# Patient Record
Sex: Male | Born: 1935 | Race: White | Hispanic: No | Marital: Married | Smoking: Former smoker
Health system: Southern US, Community
[De-identification: ages and names within clinical notes are randomized; demographics above are authoritative.]

## PROBLEM LIST (undated history)

## (undated) DIAGNOSIS — I10 Essential (primary) hypertension: Secondary | ICD-10-CM

## (undated) HISTORY — PX: CORONARY ARTERY BYPASS GRAFT: SHX141

## (undated) HISTORY — PX: OTHER SURGICAL HISTORY: SHX169

## (undated) HISTORY — PX: INNER EAR SURGERY: SHX679

## (undated) HISTORY — PX: BACK SURGERY: SHX140

---

## 2016-06-21 ENCOUNTER — Encounter (HOSPITAL_COMMUNITY): Payer: Self-pay | Admitting: Emergency Medicine

## 2016-06-21 ENCOUNTER — Emergency Department (HOSPITAL_COMMUNITY): Payer: Medicare Other

## 2016-06-21 ENCOUNTER — Emergency Department (HOSPITAL_COMMUNITY)
Admission: EM | Admit: 2016-06-21 | Discharge: 2016-06-21 | Disposition: A | Discharge status: Home / Self Care | Payer: Medicare Other | Attending: Emergency Medicine | Admitting: Emergency Medicine

## 2016-06-21 DIAGNOSIS — Z87891 Personal history of nicotine dependence: Secondary | ICD-10-CM | POA: Diagnosis not present

## 2016-06-21 DIAGNOSIS — I10 Essential (primary) hypertension: Secondary | ICD-10-CM | POA: Insufficient documentation

## 2016-06-21 DIAGNOSIS — Z955 Presence of coronary angioplasty implant and graft: Secondary | ICD-10-CM | POA: Insufficient documentation

## 2016-06-21 DIAGNOSIS — L03116 Cellulitis of left lower limb: Secondary | ICD-10-CM | POA: Diagnosis not present

## 2016-06-21 DIAGNOSIS — M79605 Pain in left leg: Secondary | ICD-10-CM | POA: Diagnosis present

## 2016-06-21 HISTORY — DX: Essential (primary) hypertension: I10

## 2016-06-21 MED ORDER — BACITRACIN ZINC 500 UNIT/GM EX OINT
TOPICAL_OINTMENT | Freq: Every day | CUTANEOUS | Status: DC
  Administered 2016-06-21: 1 via TOPICAL
  Filled 2016-06-21: qty 0.9

## 2016-06-21 MED ORDER — CLINDAMYCIN HCL 300 MG PO CAPS: 300.0000 mg | ORAL_CAPSULE | Freq: Three times a day (TID) | ORAL | 0 refills | Status: AC

## 2016-06-21 MED ORDER — MUPIROCIN CALCIUM 2 % EX CREA: 1.0000 "application " | TOPICAL_CREAM | Freq: Two times a day (BID) | CUTANEOUS | 0 refills | Status: AC

## 2016-06-21 MED ORDER — CLINDAMYCIN HCL 300 MG PO CAPS
300.0000 mg | ORAL_CAPSULE | Freq: Once | ORAL | Status: AC
  Administered 2016-06-21: 300 mg via ORAL
  Filled 2016-06-21: qty 1
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
## 2016-06-21 NOTE — ED Notes (Signed)
Bacitracin ointment and dressing applied to patient's left lower leg wound.

## 2016-06-21 NOTE — ED Notes (Signed)
Patient transported to X-ray 

## 2016-06-21 NOTE — Discharge Instructions (Signed)
Take your antibiotic with food  Take tylenol 500 to 1000 mg (1 or 2 tablets) every 6 hours for pain

## 2016-06-21 NOTE — ED Notes (Signed)
Patient ambulatory from triage

## 2016-06-21 NOTE — ED Notes (Signed)
MD at bedside. 
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
## 2016-06-21 NOTE — ED Notes (Signed)
Discharge instructions, follow up care, and rx x2 reviewed with patient. Patient verbalized understanding. 

## 2017-09-17 IMAGING — CR DG KNEE COMPLETE 4+V*L*
4 series · 4 of 4 positions shown · non-contrast
Comparison: None.

CLINICAL DATA: Left leg pain

EXAM:
LEFT KNEE - COMPLETE 4+ VIEW

[t knee ap left]
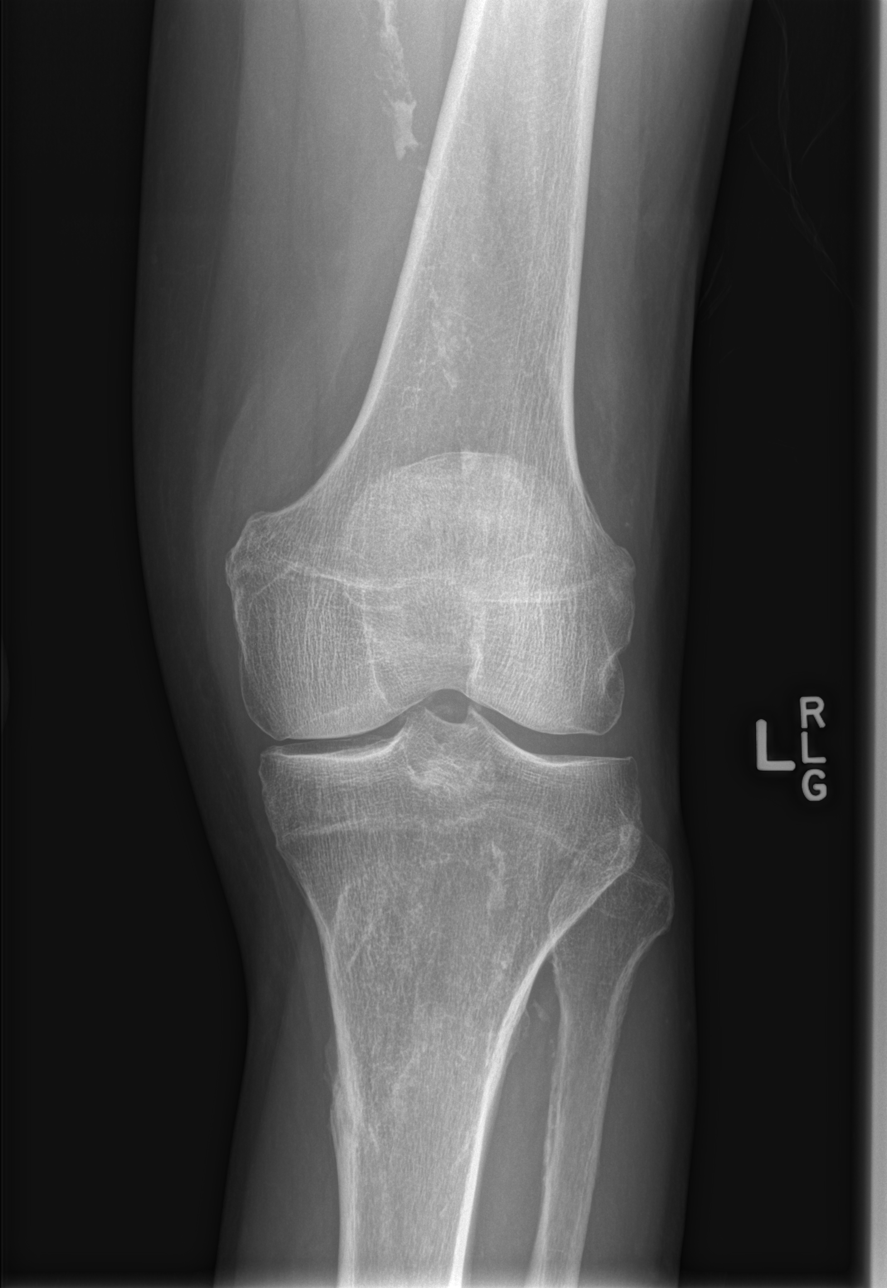

[t knee obl left (1 of 2)]
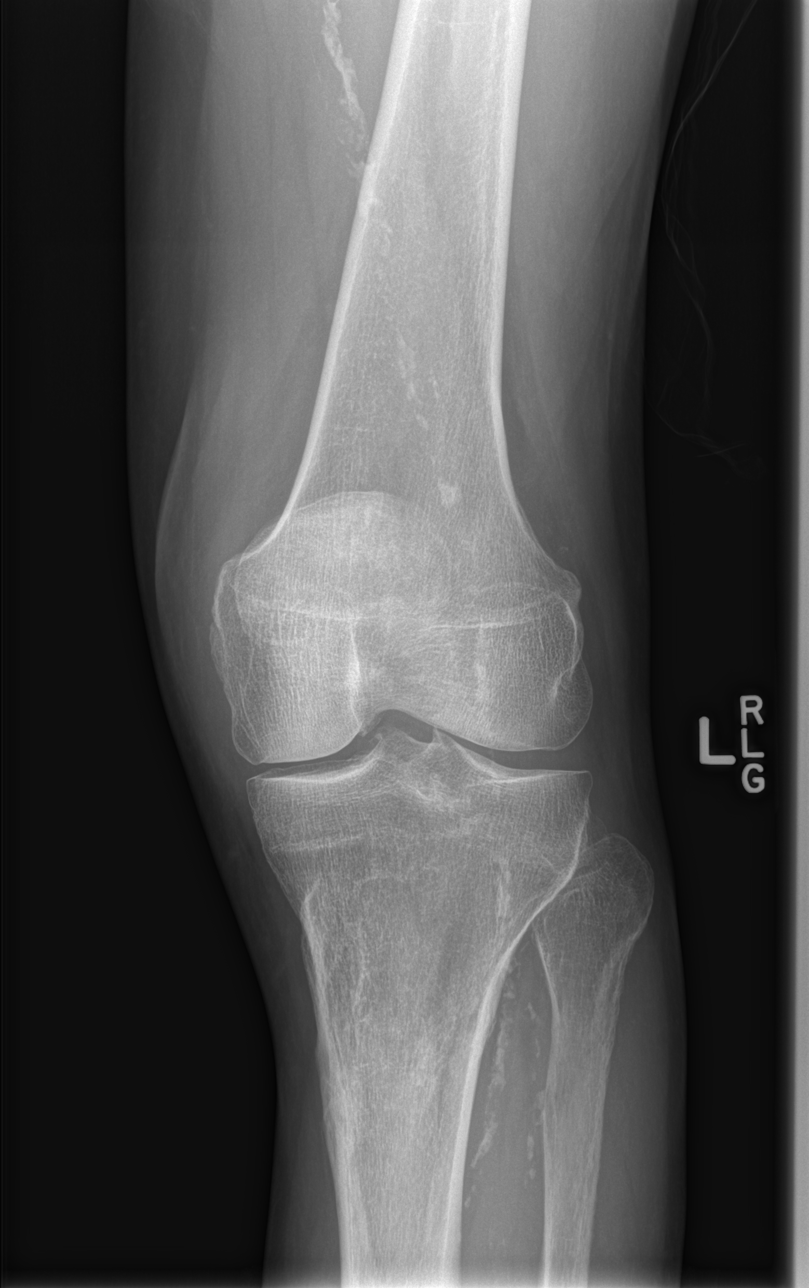

[t knee obl left (2 of 2)]
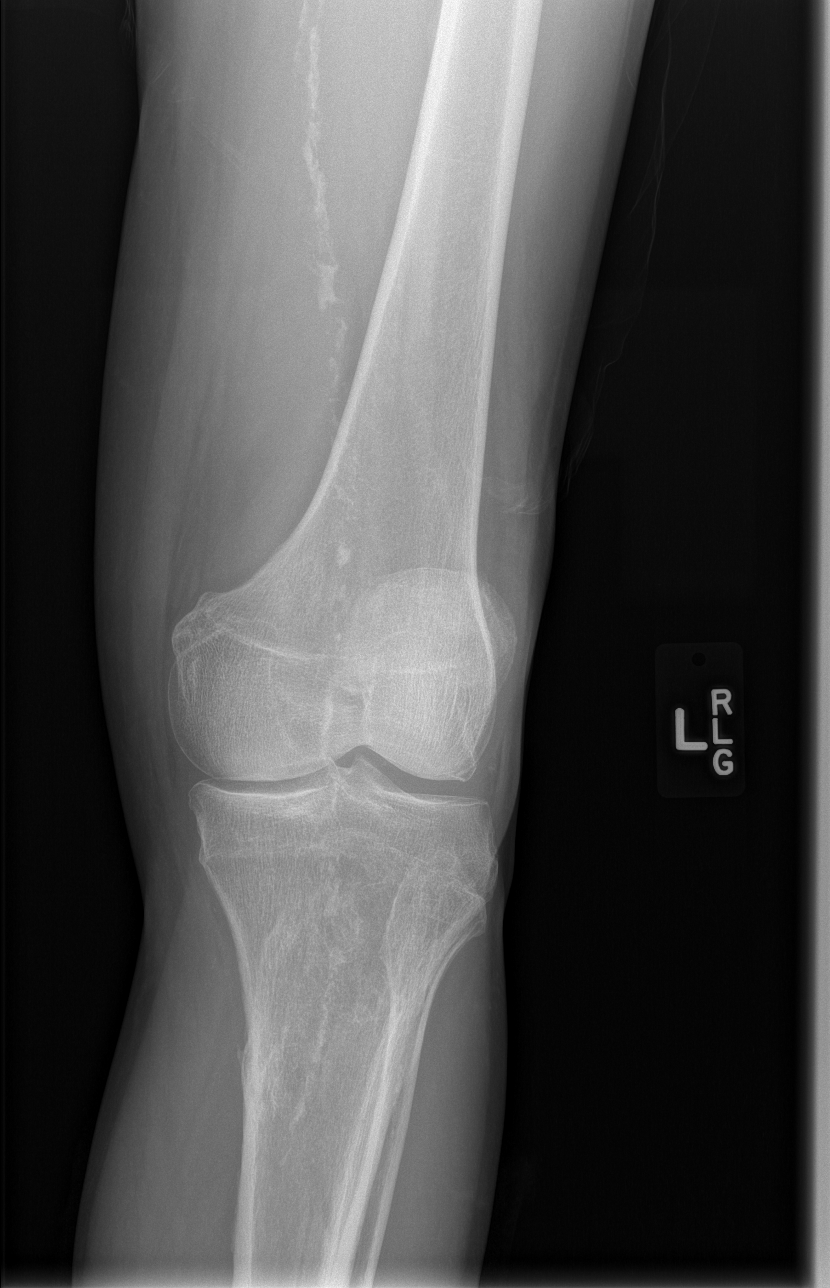

[t knee lat left]
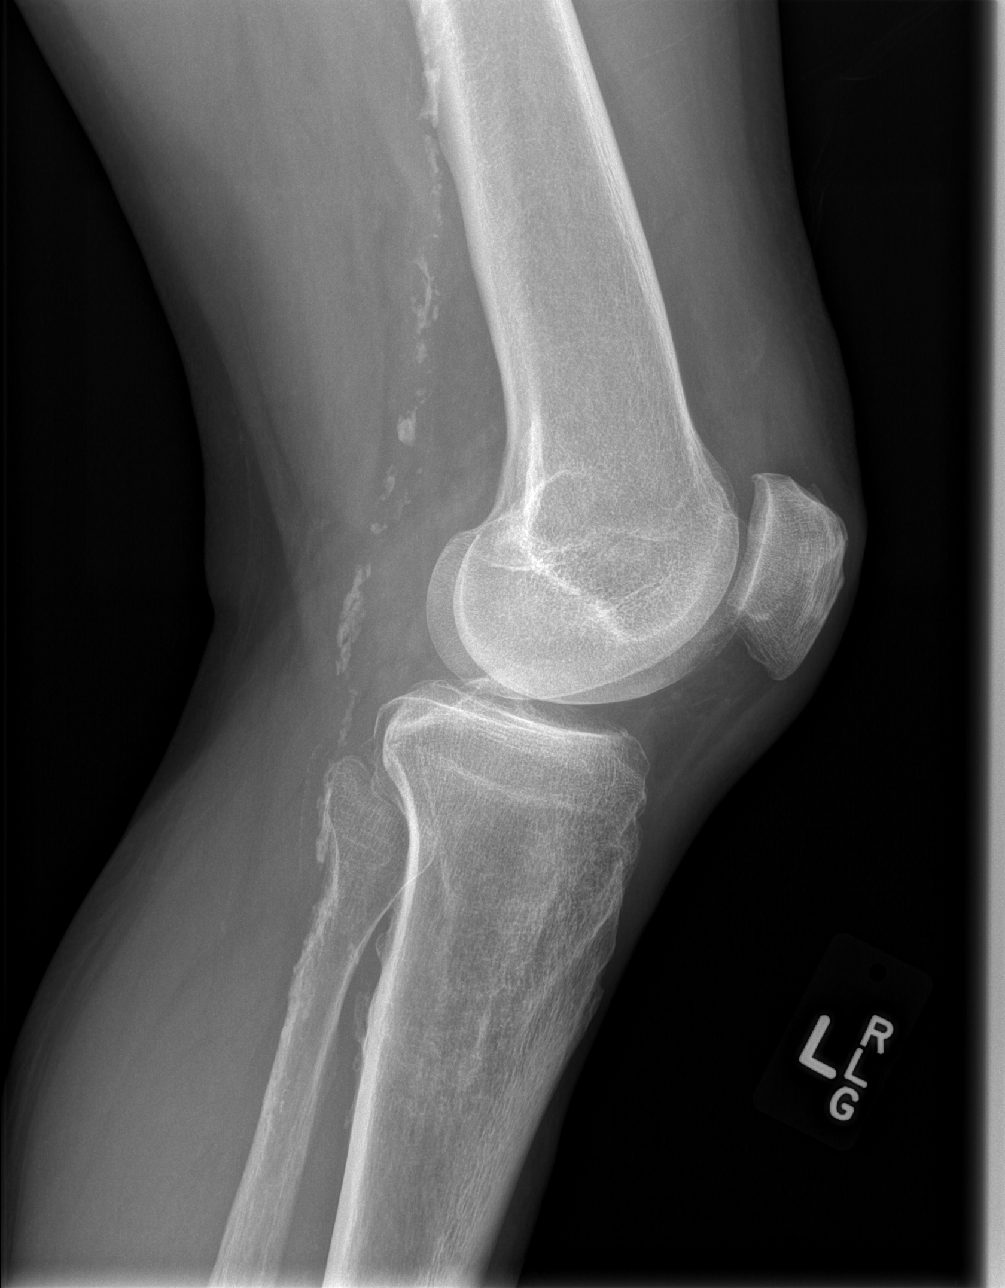

[4 of 4 positions shown; findings below may reference images not displayed]

FINDINGS: Negative for fracture or dislocation.

Mild joint space narrowing medially. Lateral joint space normal.
Patellofemoral joint normal. Small joint effusion.

Moderate arterial calcification
IMPRESSION: Mild degenerative change medially.  No acute abnormality.
